# Patient Record
Sex: Male | Born: 1940 | Race: White | Hispanic: No | Marital: Married | Smoking: Never smoker
Health system: Southern US, Community
[De-identification: ages and names within clinical notes are randomized; demographics above are authoritative.]

---

## 2007-07-16 ENCOUNTER — Ambulatory Visit: Payer: Self-pay | Admitting: Family Medicine

## 2008-12-19 ENCOUNTER — Emergency Department: Payer: Self-pay | Admitting: Emergency Medicine

## 2012-07-09 ENCOUNTER — Emergency Department: Payer: Self-pay | Admitting: Emergency Medicine

## 2012-07-10 LAB — URINALYSIS, COMPLETE
Bacteria: NONE SEEN
Bilirubin,UR: NEGATIVE
Glucose,UR: NEGATIVE mg/dL (ref 0–75)
Hyaline Cast: 3
Ketone: NEGATIVE
Ph: 6 (ref 4.5–8.0)
RBC,UR: 41 /HPF (ref 0–5)
Squamous Epithelial: NONE SEEN

## 2013-07-11 ENCOUNTER — Emergency Department: Payer: Self-pay | Admitting: Emergency Medicine

## 2013-07-11 LAB — COMPREHENSIVE METABOLIC PANEL
ALT: 15 U/L (ref 12–78)
ANION GAP: 2 — AB (ref 7–16)
Albumin: 3.4 g/dL (ref 3.4–5.0)
Alkaline Phosphatase: 51 U/L
BILIRUBIN TOTAL: 0.6 mg/dL (ref 0.2–1.0)
BUN: 14 mg/dL (ref 7–18)
CO2: 31 mmol/L (ref 21–32)
Calcium, Total: 8.8 mg/dL (ref 8.5–10.1)
Chloride: 104 mmol/L (ref 98–107)
Creatinine: 0.84 mg/dL (ref 0.60–1.30)
EGFR (African American): >60
GLUCOSE: 116 mg/dL — AB (ref 65–99)
OSMOLALITY: 275 (ref 275–301)
POTASSIUM: 4.4 mmol/L (ref 3.5–5.1)
SGOT(AST): 31 U/L (ref 15–37)
SODIUM: 137 mmol/L (ref 136–145)
Total Protein: 6.6 g/dL (ref 6.4–8.2)

## 2013-07-11 LAB — TROPONIN I: Troponin-I: <0.02 ng/mL

## 2013-07-11 LAB — CBC
HCT: 44.7 % (ref 40.0–52.0)
HGB: 14.9 g/dL (ref 13.0–18.0)
MCH: 30.4 pg (ref 26.0–34.0)
MCHC: 33.3 g/dL (ref 32.0–36.0)
MCV: 92 fL (ref 80–100)
PLATELETS: 151 10*3/uL (ref 150–440)
RBC: 4.89 10*6/uL (ref 4.40–5.90)
RDW: 14 % (ref 11.5–14.5)
WBC: 8.5 10*3/uL (ref 3.8–10.6)

## 2013-07-11 LAB — PRO B NATRIURETIC PEPTIDE: B-Type Natriuretic Peptide: 648 pg/mL — ABNORMAL HIGH (ref 0–125)

## 2014-05-27 IMAGING — CT CT ANGIO CHEST
3 of 8 series · 16 of 36 positions shown · IV contrast (APPLIED)
Comparison: Chest radiograph performed earlier today at [DATE] p.m.

CLINICAL DATA: Shortness of breath for 2 days.  Tachypnea.

EXAM:
CT ANGIOGRAPHY CHEST WITH CONTRAST
TECHNIQUE: Multidetector CT imaging of the chest was performed using the
standard protocol during bolus administration of intravenous
contrast. Multiplanar CT image reconstructions including MIPs were
obtained to evaluate the vascular anatomy.
CONTRAST:  80 mL of Isovue 370 IV contrast

[Series 5: pe 1.0 thins · axial · 0.79mm/px · z∈[-81,+90]mm · 10 of 211 slices shown (1 of 2)]
[im 20/211  lung]
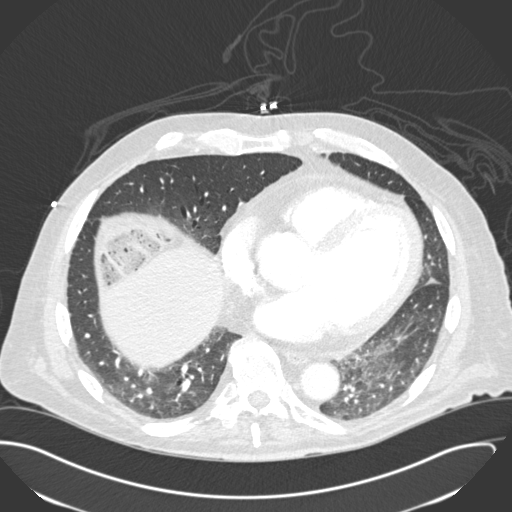
[im 39/211  mediastinal]
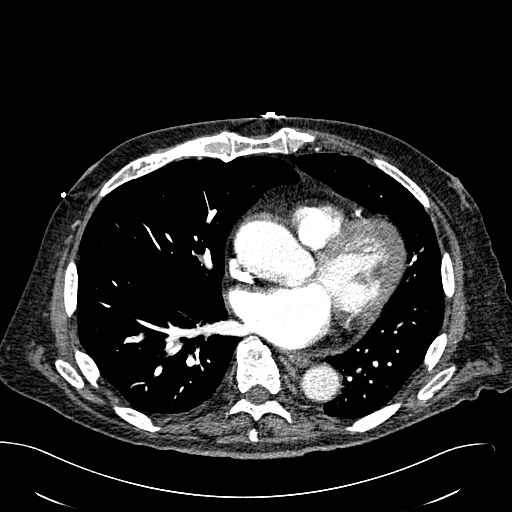
[im 58/211  lung]
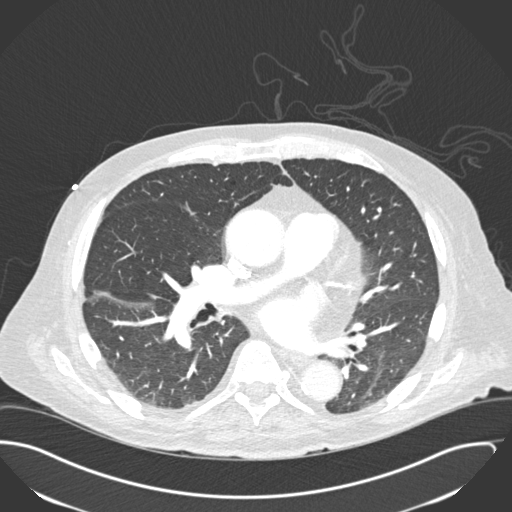
[im 77/211  mediastinal]
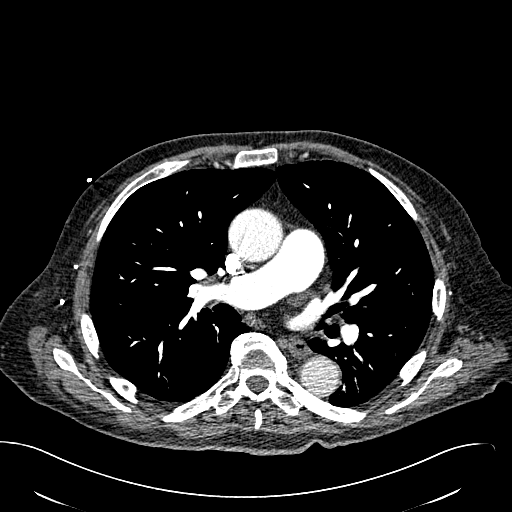
[im 96/211  lung]
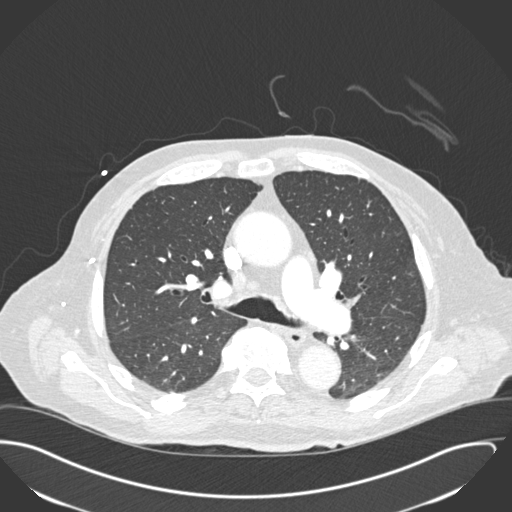
[im 115/211  mediastinal]
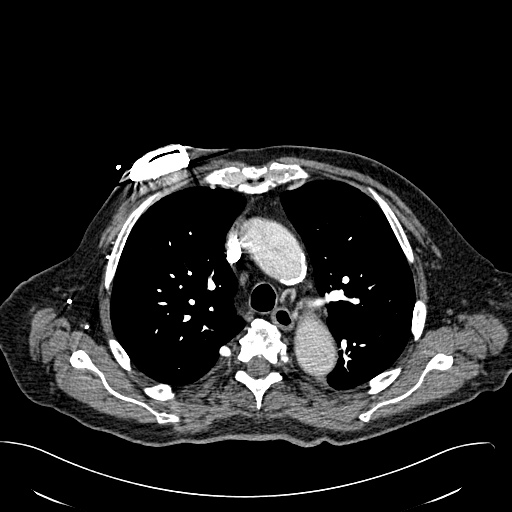
[im 134/211  lung]
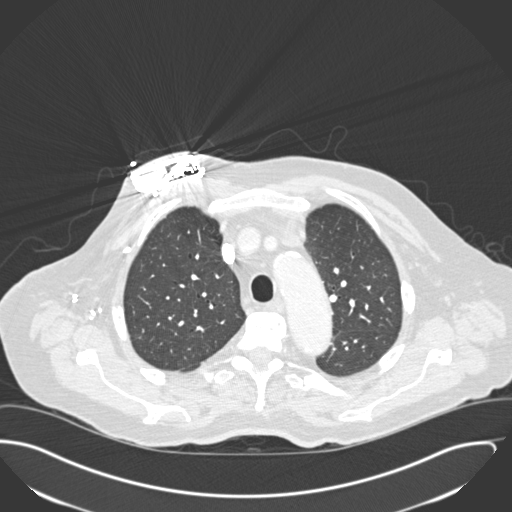
[im 153/211  mediastinal]
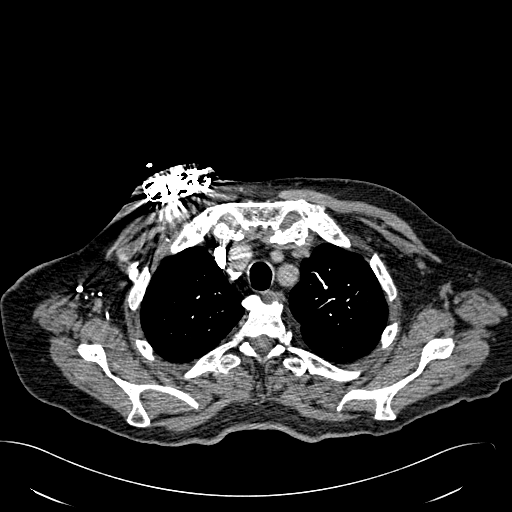
[im 172/211  lung]
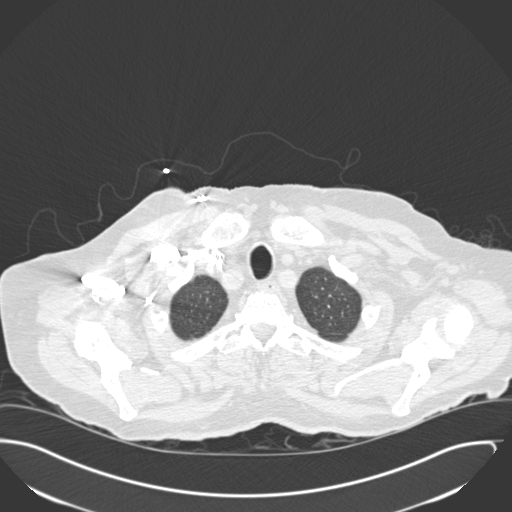
[im 191/211  mediastinal]
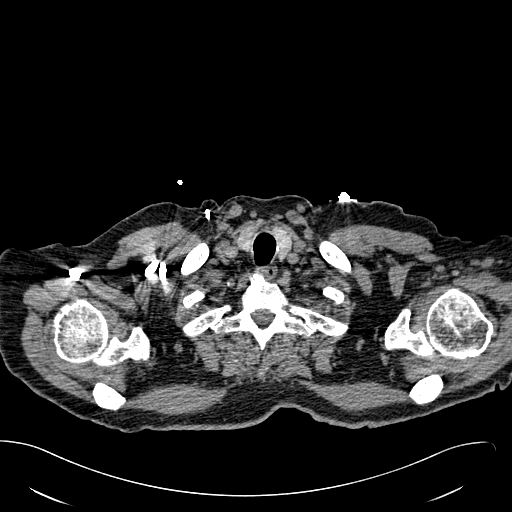

[Series 8: pe 1.0 thins · axial · 0.79mm/px · z∈[-127,-51]mm · 5 of 114 slices shown (2 of 2)]
[im 19/114  lung]
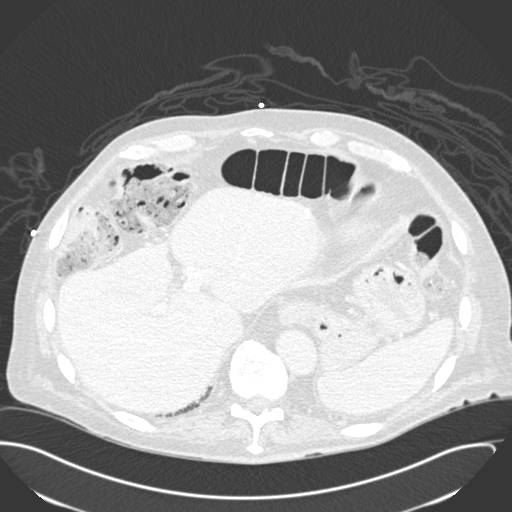
[im 38/114  lung]
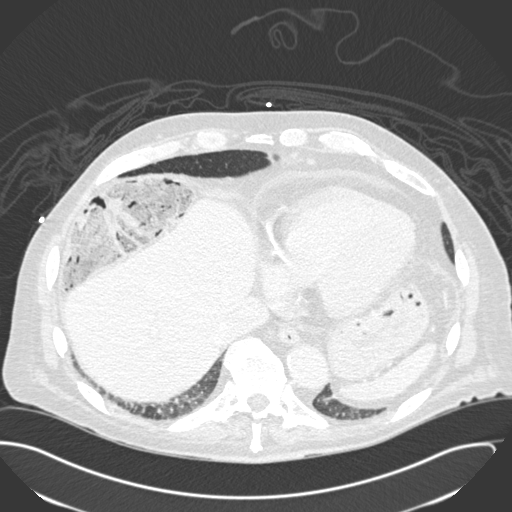
[im 57/114  lung]
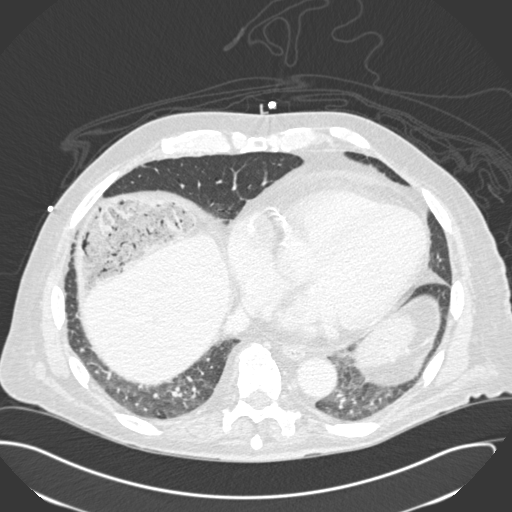
[im 76/114  lung]
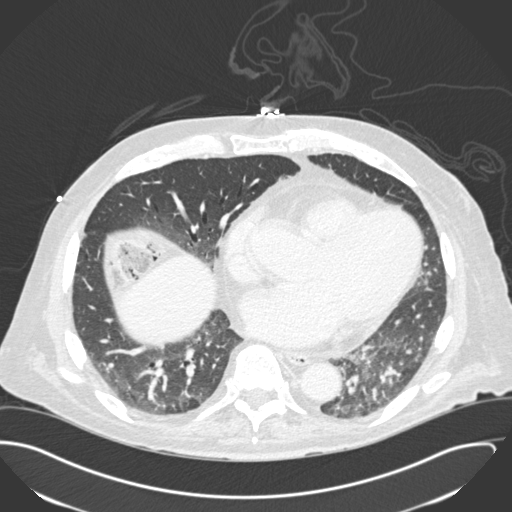
[im 95/114  lung]
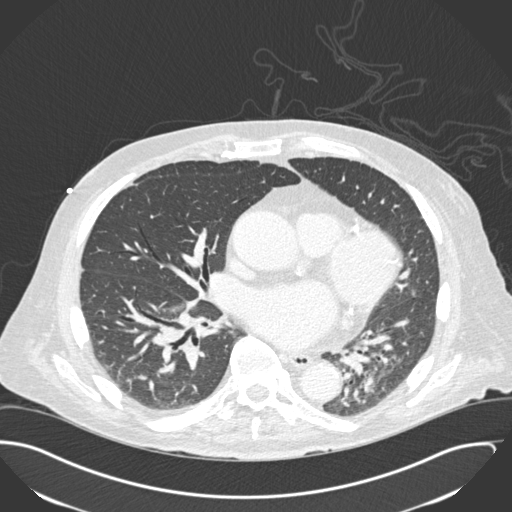

[Series 10: cor pe 2.0 mpr · coronal · 0.42mm/px · 1 of 132 slices shown]
[im 66/132  mediastinal]
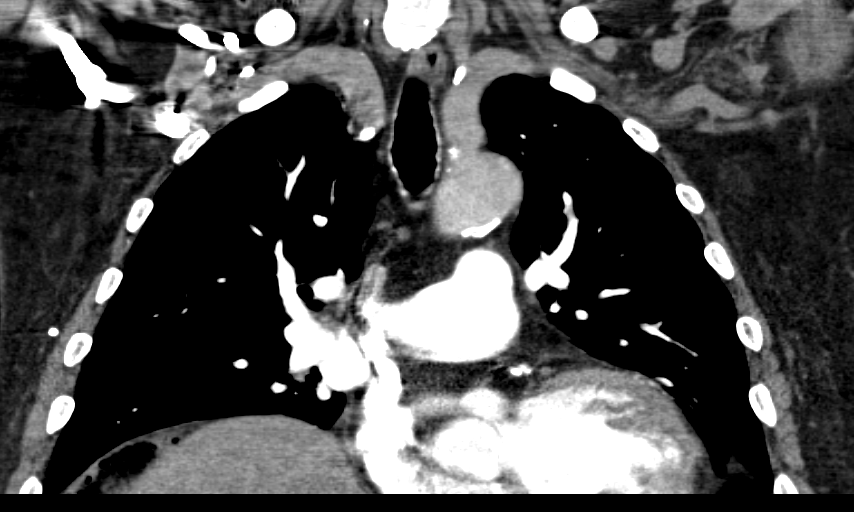

[16 of 36 positions shown; findings below may reference images not displayed]

FINDINGS: There is no evidence of pulmonary embolus.

Minimal bibasilar opacities likely reflect atelectasis. There is no
evidence of pleural effusion or pneumothorax. No masses are
identified; no abnormal focal contrast enhancement is seen.

There is aneurysmal dilatation of the ascending thoracic aorta to
4.6 cm in maximal AP dimension. Aneurysmal dilatation resolves at
the level of the aortic arch. Diffuse coronary artery calcifications
are seen. Trace pericardial fluid remains within normal limits. No
mediastinal lymphadenopathy is seen. The great vessels are grossly
unremarkable in appearance. Scattered calcific atherosclerotic
disease is seen along the aortic arch. No axillary lymphadenopathy
is seen. The visualized portions of the thyroid gland are
unremarkable in appearance.

A metallic device is seen along the right chest wall. The visualized
portions of the liver and spleen are unremarkable. There is
interposition of the hepatic flexure of the colon anterior to the
liver.

No acute osseous abnormalities are seen. Anterior bridging
osteophytes are seen along the lower thoracic spine, compatible with
DISH.

Review of the MIP images confirms the above findings.
IMPRESSION: 1. No evidence of pulmonary embolus.
2. Minimal bibasilar airspace opacities likely reflect atelectasis;
lungs otherwise clear.
3. Aneurysmal dilatation of the ascending thoracic aorta to 4.6 cm
in maximal AP dimension; this resolves at the level of the aortic
arch.
4. Diffuse coronary artery calcifications seen.

## 2015-01-29 ENCOUNTER — Emergency Department: Payer: Medicare Other

## 2015-01-29 ENCOUNTER — Emergency Department
Admission: EM | Admit: 2015-01-29 | Discharge: 2015-01-29 | Disposition: A | Discharge status: Other Acute Inpatient Hospital | Payer: Medicare Other | Attending: Emergency Medicine | Admitting: Emergency Medicine

## 2015-01-29 DIAGNOSIS — M6281 Muscle weakness (generalized): Secondary | ICD-10-CM | POA: Diagnosis present

## 2015-01-29 DIAGNOSIS — I639 Cerebral infarction, unspecified: Secondary | ICD-10-CM | POA: Insufficient documentation

## 2015-01-29 LAB — CBC WITH DIFFERENTIAL/PLATELET
BASOS ABS: 0 10*3/uL (ref 0–0.1)
Basophils Relative: 1 %
Eosinophils Absolute: 0.3 10*3/uL (ref 0–0.7)
Eosinophils Relative: 4 %
HCT: 40.4 % (ref 40.0–52.0)
HEMOGLOBIN: 13.9 g/dL (ref 13.0–18.0)
LYMPHS PCT: 27 %
Lymphs Abs: 1.9 10*3/uL (ref 1.0–3.6)
MCH: 31.4 pg (ref 26.0–34.0)
MCHC: 34.6 g/dL (ref 32.0–36.0)
MCV: 90.9 fL (ref 80.0–100.0)
Monocytes Absolute: 0.5 10*3/uL (ref 0.2–1.0)
Monocytes Relative: 7 %
NEUTROS ABS: 4.4 10*3/uL (ref 1.4–6.5)
Neutrophils Relative %: 61 %
Platelets: 128 10*3/uL — ABNORMAL LOW (ref 150–440)
RBC: 4.44 MIL/uL (ref 4.40–5.90)
RDW: 14 % (ref 11.5–14.5)
WBC: 7.1 10*3/uL (ref 3.8–10.6)

## 2015-01-29 LAB — COMPREHENSIVE METABOLIC PANEL
ALBUMIN: 3.6 g/dL (ref 3.5–5.0)
ALK PHOS: 50 U/L (ref 38–126)
ALT: 20 U/L (ref 17–63)
ANION GAP: 7 (ref 5–15)
AST: 24 U/L (ref 15–41)
BUN: 17 mg/dL (ref 6–20)
CO2: 29 mmol/L (ref 22–32)
CREATININE: 0.79 mg/dL (ref 0.61–1.24)
Calcium: 8.9 mg/dL (ref 8.9–10.3)
Chloride: 101 mmol/L (ref 101–111)
GFR calc Af Amer: >60 mL/min (ref >60)
GFR calc non Af Amer: >60 mL/min (ref >60)
GLUCOSE: 110 mg/dL — AB (ref 65–99)
POTASSIUM: 4.3 mmol/L (ref 3.5–5.1)
SODIUM: 137 mmol/L (ref 135–145)
Total Bilirubin: 0.6 mg/dL (ref 0.3–1.2)
Total Protein: 6.3 g/dL — ABNORMAL LOW (ref 6.5–8.1)

## 2015-01-29 LAB — URINALYSIS COMPLETE WITH MICROSCOPIC (ARMC ONLY)
BILIRUBIN URINE: NEGATIVE
Bacteria, UA: NONE SEEN
Glucose, UA: NEGATIVE mg/dL
HGB URINE DIPSTICK: NEGATIVE
KETONES UR: NEGATIVE mg/dL
Nitrite: NEGATIVE
PROTEIN: NEGATIVE mg/dL
Specific Gravity, Urine: 1.009 (ref 1.005–1.030)
pH: 7 (ref 5.0–8.0)

## 2015-01-29 LAB — TROPONIN I

## 2015-01-29 MED ORDER — PROPRANOLOL HCL 1 MG/ML IV SOLN
1.0000 mg | Freq: Once | INTRAVENOUS | Status: AC
  Administered 2015-01-29: 1 mg via INTRAVENOUS
  Filled 2015-01-29: qty 1

## 2015-01-29 MED ORDER — ASPIRIN 81 MG PO CHEW
324.0000 mg | CHEWABLE_TABLET | Freq: Once | ORAL | Status: AC
  Administered 2015-01-29: 324 mg via ORAL
  Filled 2015-01-29: qty 4

## 2015-01-29 NOTE — ED Notes (Signed)
[facility] to call [facility] after they verify they are accepting him

## 2015-01-29 NOTE — Care Management Note (Signed)
Case Management Note  Patient Details  Name: NUNO BRUBACHER MRN: [id number] Date of Birth: [birth year]  Subjective/Objective:    Pt [facility]                Action/Plan:   Expected Discharge Date:                  Expected Discharge Plan:     In-House Referral:     Discharge planning Services     Post Acute Care Choice:    Choice offered to:     DME Arranged:    DME Agency:     HH Arranged:    HH Agency:     Status of Service:     Medicare Important Message Given:    Date Medicare IM Given:    Medicare IM give by:    Date Additional Medicare IM Given:    Additional Medicare Important Message give by:     If discussed at Long Length of Stay Meetings, dates discussed:    Additional Comments:  Berna Bue, RN 01/29/2015, 2:57 PM

## 2015-01-29 NOTE — ED Notes (Signed)
Pt BIB EMS from home, reports left leg dragging and poss def in left arm.  Left arm resolved.  Some remaining problems with left leg, pt alert and oriented, did not take morning BP meds

## 2015-01-29 NOTE — Consult Note (Signed)
[facility] [place] at [facility]   PATIENT NAME: Tyler Beck    MR#:  [id number]  DATE OF BIRTH:  Aug 31, 1940  DATE OF ADMISSION:  01/29/2015  [facility]   REQUESTING/REFERRING PHYSICIAN: Dr. Lenard Lance   CHIEF COMPLAINT:  Stroke left sided weakness  HISTORY OF PRESENT ILLNESS:  Tyler Beck  is a 74 y.o. male with a known history of hypertension, diabetes, essential tremor with vagal nerve stimulator who presents with above complaint. Patient will up this morning at approximately 8:00 and at approximate 8:30 he noted his left side to be very weak and was unable to move it. Family also noted slurred speech. They called EMS and he was brought here via EMS. As far as his left side weakness it is slowly improving. He continues have a very mild slurred speech as well as a slight left facial droop. Patient also reports a headache this morning which is very unusual for him. Patient went to sleep yesterday in his usual state of health without any issues overnight.  PAST MEDICAL HISTORY:  Diabetes Essential hypertension  PAST SURGICAL HISTORY:  Patient has a vagal nerve stimulator Productively  SOCIAL HISTORY:  No tobacco, alcohol or IV drug use  FAMILY HISTORY:  Positive history of lung cancer  DRUG ALLERGIES:  No known drug allergies  REVIEW OF SYSTEMS:  CONSTITUTIONAL: No fever, fatigue or weakness. Positive headache EYES: No blurred or double vision.  EARS, NOSE, AND THROAT: No tinnitus or ear pain.  RESPIRATORY: No cough, shortness of breath, wheezing or hemoptysis.  CARDIOVASCULAR: No chest pain, orthopnea, edema.  GASTROINTESTINAL: No nausea, vomiting, diarrhea or abdominal pain.  GENITOURINARY: No dysuria, hematuria.  ENDOCRINE: No polyuria, nocturia,  HEMATOLOGY: No anemia, easy bruising or bleeding SKIN: No rash or lesion. MUSCULOSKELETAL: No joint pain or arthritis.   NEUROLOGIC: Positive left-sided weakness, left facial  droop and slurred speech PSYCHIATRY: No anxiety or depression.   MEDICATIONS AT HOME:   Pharmacy is updating patient's medications VITAL SIGNS:  Blood pressure 202/80, pulse 58, temperature 98.2 F (36.8 C), temperature source Oral, resp. rate 14, SpO2 96 %.  PHYSICAL EXAMINATION:  GENERAL:  74 y.o.-year-old patient lying in the bed with no acute distress.  EYES: Pupils equal, round, reactive to light and accommodation. No scleral icterus. Extraocular muscles intact.  HEENT: Head atraumatic, normocephalic. Oropharynx and nasopharynx clear.  NECK:  Supple, no jugular venous distention. No thyroid enlargement, no tenderness.  LUNGS: Normal breath sounds bilaterally, no wheezing, rales,rhonchi or crepitation. No use of accessory muscles of respiration.  CARDIOVASCULAR: S1, S2 normal. No murmurs, rubs, or gallops.  ABDOMEN: Soft, nontender, nondistended. Bowel sounds present. No organomegaly or mass.  EXTREMITIES: No pedal edema, cyanosis, or clubbing.  NEUROLOGIC: Patient has mild slurred speech with mild left facial droop all other cranial nerves are intact. Strength is 5 out of 5 bilaterally and symmetrically. Cerebellar exam is normal. PSYCHIATRIC: The patient is alert and oriented x 3.  SKIN: No obvious rash, lesion, or ulcer.   LABORATORY PANEL:   CBC  Recent Labs Lab 01/29/15 0929  WBC 7.1  HGB 13.9  HCT 40.4  PLT 128*   ------------------------------------------------------------------------------------------------------------------  Chemistries   Recent Labs Lab 01/29/15 0929  NA 137  K 4.3  CL 101  CO2 29  GLUCOSE 110*  BUN 17  CREATININE 0.79  CALCIUM 8.9  AST 24  ALT 20  ALKPHOS 50  BILITOT 0.6   ------------------------------------------------------------------------------------------------------------------  Cardiac Enzymes  Recent Labs Lab  01/29/15 0929  TROPONINI <0.03    ------------------------------------------------------------------------------------------------------------------  RADIOLOGY:  Ct Head Wo Contrast  01/29/2015   CLINICAL DATA:  74 year old male with new onset of left arm and leg weakness. Stimulating device in place for tremor. Initial encounter.  EXAM: CT HEAD WITHOUT CONTRAST  TECHNIQUE: Contiguous axial images were obtained from the base of the skull through the vertex without intravenous contrast.  COMPARISON:  None.  FINDINGS: Stimulating wires enter from the right frontal region, coarse immediately adjacent to the lateral ventricles with the tip at the junction of the lower thalamus/ upper aspect of the cerebral peduncles.  Streak artifacts somewhat limits evaluation. No intracranial hemorrhage identified.  Hypodensity right frontal lobe consistent with prior insult which may represent remote infarct. Taking into account limitation by streak artifact and lack of prior exams, no CT evidence of large acute infarct separate from this region.  Mild global atrophy without hydrocephalus.  Vascular calcifications.  IMPRESSION: Stimulating wires enter from the right frontal region, coarse immediately adjacent to the lateral ventricles with the tip at the junction of the lower thalamus/ upper aspect of the cerebral peduncles.  Streak artifacts somewhat limits evaluation. No intracranial hemorrhage identified.  Hypodensity right frontal lobe consistent with prior insult which may represent remote infarct. Taking into account limitation by streak artifact and lack of prior exams, no CT evidence of large acute infarct separate from this region.  Mild global atrophy without hydrocephalus.   Electronically Signed   By: Lacy Duverney M.D.   On: 01/29/2015 10:20    EKG:  NO ST elevation or depression  IMPRESSION AND PLAN:  74 year old male with a history of hypertension and diabetes who presents with left-sided weakness, slurred speech and facial droop.  1.  Acute CVA: Patient symptoms are consistent with a stroke. Patient is unable to obtain an MRI due to the fact the patient has a vagal nerve stimulator. I will order a neurology consultation. Neuro checks every 4 hours. He may need a repeat CT scan in 36 hours.He will need ultrasound of the carotids as well as echocardiogram. He is on aspirin daily and will need to change to Plavix. He will require physical therapy, occupational therapy and speech consultation.   2. Malignant hypertension: Patient's blood pressure systolic is over 063. Allow some permissive hypertension. Hydralazine can be ordered when necessary for systolic blood pressure greater than 180 or diastolic blood pressure greater than 100.  3. Diabetes: Patient will resume his outpatient medications. ADA diet and sliding scale insulin and have also been ordered.  4. Essential tremor: Patient has a vagal nerve stimulator.  Patient is being transferred to [place]. He [place] a [place] patient.     All the records are reviewed and case discussed with ED provider. Management plans discussed with the patient and family and they are in agreement.  CODE STATUS: DNR  TOTAL TIME TAKING CARE OF THIS PATIENT: 50 minutes.    Manju Kulkarni M.D on 01/29/2015 at 2:45 PM  Between 7am to 6pm - Pager - [phone] After 6pm go to [email or web address] - password [email or web address] [facility]  [facility]  Office  [phone]  CC: Primary care physician; No primary care provider on file.

## 2015-01-29 NOTE — ED Notes (Signed)
Resumed care from [facility]; Pt alert & oriented. Denies pain, although he states he has some pressure in his stomach. NAD NAD noted.

## 2015-01-29 NOTE — ED Provider Notes (Signed)
[facility] Emergency Department Provider Note  Time seen: 9:23 AM  I have reviewed the triage vital signs and the nursing notes.   HISTORY  Chief Complaint Cerebrovascular Accident    HPI Tyler Beck is a 74 y.o. male presents the emergency department with left-sided weakness. According to the patient he felt normal going to bed last night. He states he woke up at 8:15 this morning, and attempted to get out of bed but he could not lift his left leg adequately. He states he was dragging his left leg down the hallway, and needed his wife's help to ambulate. He states he has never had weakness in his leg before. Denies any history of stroke. Patient's last known normal was last night before going to bed. Denies any use of blood thinners. Denies any recent falls or trauma.Does state mild current headache.     No past medical history on file.  There are no active problems to display for this patient.   No past surgical history on file.  No current outpatient prescriptions on file.  Allergies Review of patient's allergies indicates not on file.  No family history on file.  Social History History  Substance Use Topics  . Smoking status: Not on file  . Smokeless tobacco: Not on file  . Alcohol Use: Not on file    Review of Systems Constitutional: Negative for fever Cardiovascular: Negative for chest pain. Respiratory: Negative for shortness of breath. Musculoskeletal: Negative for back pain. Neurological: Mild headache. Left leg and arm weakness. Denies any numbness. 10-point ROS otherwise negative.  ____________________________________________   PHYSICAL EXAM:  Constitutional: Alert and oriented. Well appearing and in no distress. Eyes: Normal exam, 2 mm PERRL bilaterally ENT   Head: Normocephalic and atraumatic. Cardiovascular: Normal rate, regular rhythm. No murmur Respiratory: Normal respiratory effort without tachypnea nor  retractions. Breath sounds are clear and equal bilaterally. No wheezes/rales/rhonchi. Gastrointestinal: Soft and nontender. No distention.  Musculoskeletal: Nontender, normal range of motion in all extremities. No lower extremity tenderness or swelling. Neurologic:  Normal speech and language. Patient with 4+/5 motor in left lower extremity, minimal drift after 5 seconds. All other extremities are 5/5 without drift. Cranial nerves intact. Skin:  Skin is warm, dry and intact.  Psychiatric: Mood and affect are normal. Speech and behavior are normal.   ____________________________________________    EKG  EKG reviewed and interpreted by myself shows sinus bradycardia 56 bpm, narrow QRS, normal axis, prolonged PR interval consistent with a first-degree AV block, nonspecific ST changes are present.  ____________________________________________    RADIOLOGY  CT is most consistent with a remote right frontal infarct. Patient denies any history of stroke. Patient does have a deep brain stimulator for his tremor which makes interpretation more difficult.  ____________________________________________    INITIAL IMPRESSION / ASSESSMENT AND PLAN / ED COURSE  Pertinent labs & imaging results that were available during my care of the patient were reviewed by me and considered in my medical decision making (see chart for details).  Patient with new onset left lower extremity weakness. He also says his left arm feels heavy, and he has a mild headache. Last known normal was last night before going to bed. Patient is not a TPA candidate given unknown onset of symptoms. We'll proceed with stroke workup including labs, CT head. We'll closely monitor in the emergency department while awaiting results.  CT shows a possible remote infarct, but difficult to interpret given deep brain stimulator. Given new onset of  symptoms, we will admit to the hospital for a stroke workup. Patient's labs are largely within  normal limits. Currently NIH stroke scale of 3, given the left lower extremity weakness, and limb ataxia which is likely due to underlying tremor.   NIH Stroke Scale    Time: 10:58 AM Person Administering Scale: Dennison Mcdaid  Administer stroke scale items in the order listed. Record performance in each category after each subscale exam. Do not go back and change scores. Follow directions provided for each exam technique. Scores should reflect what the patient does, not what the clinician thinks the patient can do. The clinician should record answers while administering the exam and work quickly. Except where indicated, the patient should not be coached (i.e., repeated requests to patient to make a special effort).   1a  Level of consciousness: 0=alert; keenly responsive  1b. LOC questions:  0=Performs both tasks correctly  1c. LOC commands: 0=Performs both tasks correctly  2.  Best Gaze: 0=normal  3.  Visual: 0=No visual loss  4. Facial Palsy: 0=Normal symmetric movement  5a.  Motor left arm: 0=No drift, limb holds 90 (or 45) degrees for full 10 seconds  5b.  Motor right arm: 0=No drift, limb holds 90 (or 45) degrees for full 10 seconds  6a. motor left leg: 1=Drift, limb holds 90 (or 45) degrees but drifts down before full 10 seconds: does not hit bed  6b  Motor right leg:  0=No drift, limb holds 90 (or 45) degrees for full 10 seconds  7. Limb Ataxia: 2=Present in two limbs  8.  Sensory: 0=Normal; no sensory loss  9. Best Language:  0=No aphasia, normal  10. Dysarthria: 0=Normal  11. Extinction and Inattention: 0=No abnormality  12. Distal motor function: 0=Normal   Total:   3     ____________________________________________   FINAL CLINICAL IMPRESSION(S) / ED DIAGNOSES  Left leg weakness Cerebrovascular accident  Minna Antis, MD 01/29/15 1059

## 2015-01-29 NOTE — ED Notes (Signed)
[facility]; gave report to rosanna; we are to call her when ems leaves.

## 2015-01-29 NOTE — Care Management Note (Signed)
Case Management Note  Patient Details  Name: DAEMYN GARIEPY MRN: [id number] Date of Birth: [birth year]  Subjective/Objective:            The pt. Has VA benefits and both he and family at bedside understand same. Paperwork to be filled out by [employer], unit sec, and Dr Laurell Josephs to go to [place], Pt family and pt. Agree to transfer pt. If bed available.  Dr Juliene Pina also made aware.        Action/Plan:   Expected Discharge Date:                  Expected Discharge Plan:     In-House Referral:     Discharge planning Services     Post Acute Care Choice:    Choice offered to:     DME Arranged:    DME Agency:     HH Arranged:    HH Agency:     Status of Service:     Medicare Important Message Given:    Date Medicare IM Given:    Medicare IM give by:    Date Additional Medicare IM Given:    Additional Medicare Important Message give by:     If discussed at Long Length of Stay Meetings, dates discussed:    Additional Comments:  Berna Bue, RN 01/29/2015, 11:14 AM

## 2015-01-29 NOTE — ED Notes (Signed)
rcvd call from [facility] center; gave me number to call report [phone]; we are to arrange transport

## 2015-12-15 IMAGING — CT CT HEAD W/O CM
1 series · 15 of 28 positions shown, 19 images · non-contrast
Comparison: None.

CLINICAL DATA: 74-year-old male with new onset of left arm and leg
weakness. Stimulating device in place for tremor. Initial encounter.

EXAM:
CT HEAD WITHOUT CONTRAST
TECHNIQUE: Contiguous axial images were obtained from the base of the skull
through the vertex without intravenous contrast.

[Series 2: soft tissue · axial · 0.42mm/px · z∈[+586,+711]mm · 15 of 28 slices shown, 19 images]
[im 2/28  brain]
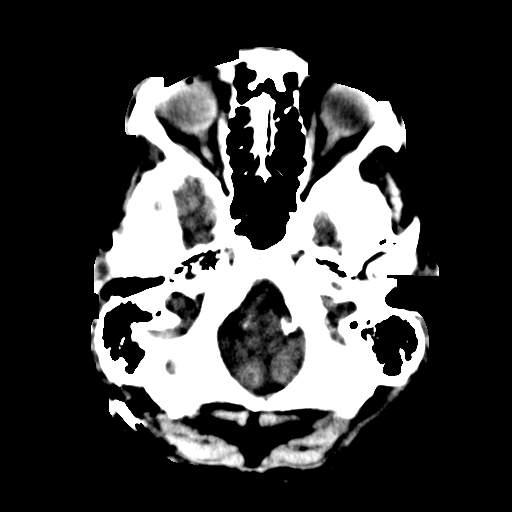
[im 2/28  bone]
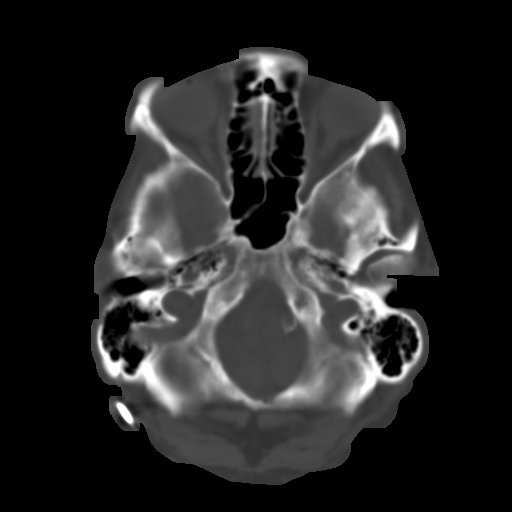
[im 4/28  brain]
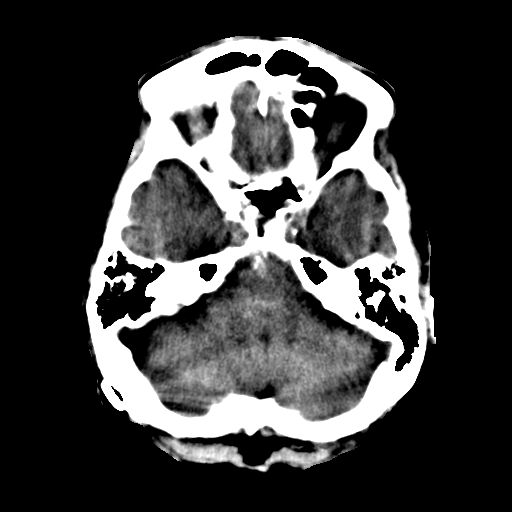
[im 6/28  brain]
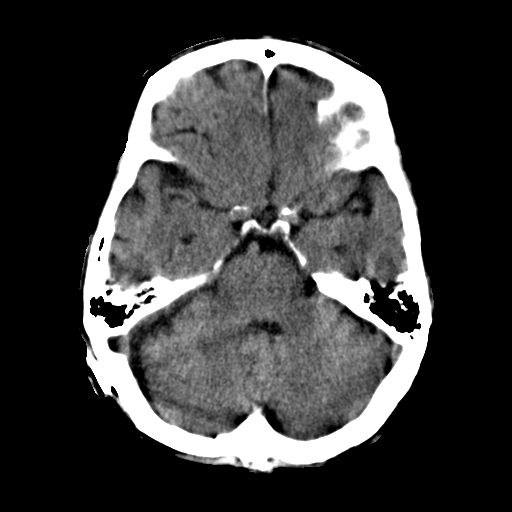
[im 8/28  brain]
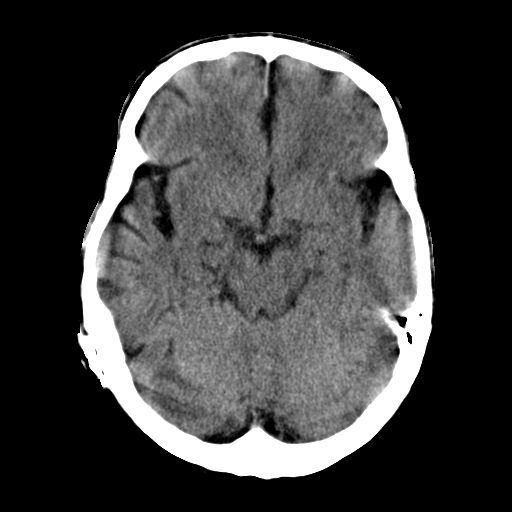
[im 9/28  brain]
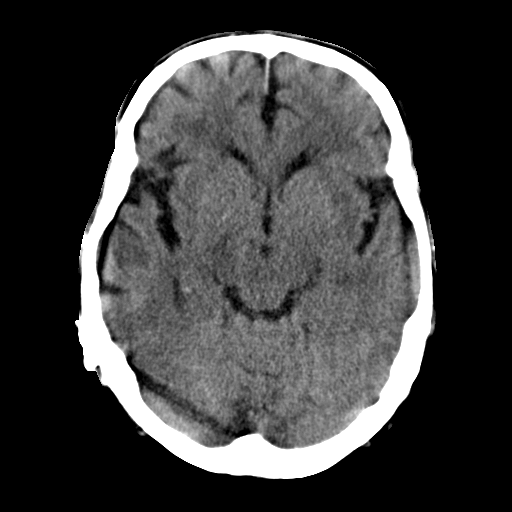
[im 9/28  bone]
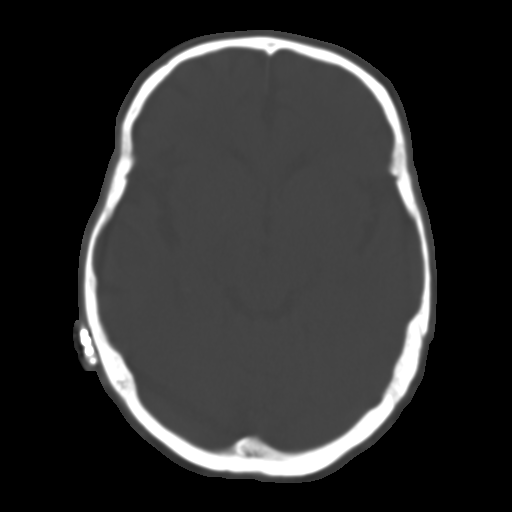
[im 11/28  brain]
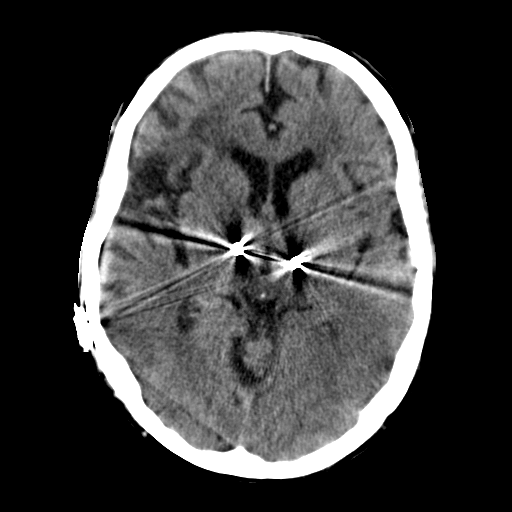
[im 13/28  brain]
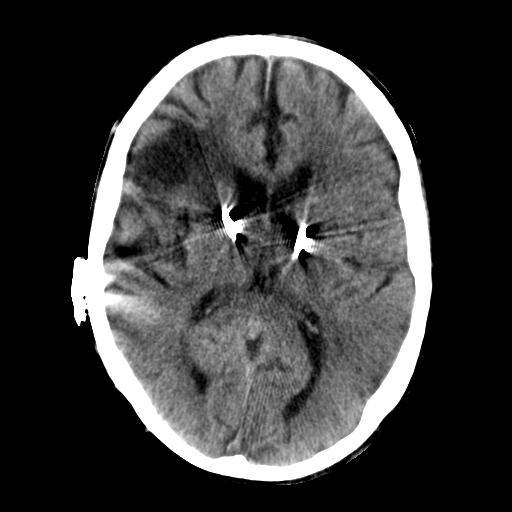
[im 15/28  brain]
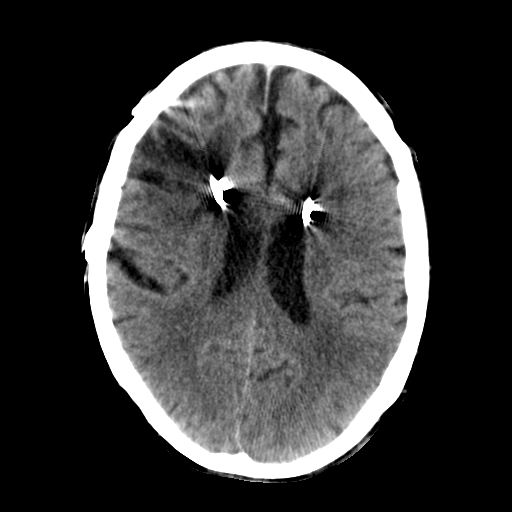
[im 16/28  brain]
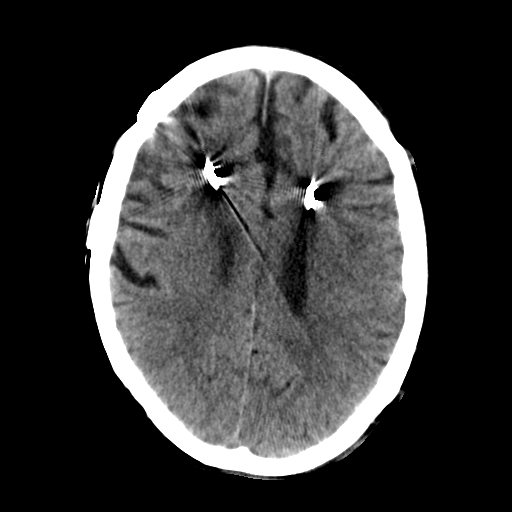
[im 16/28  bone]
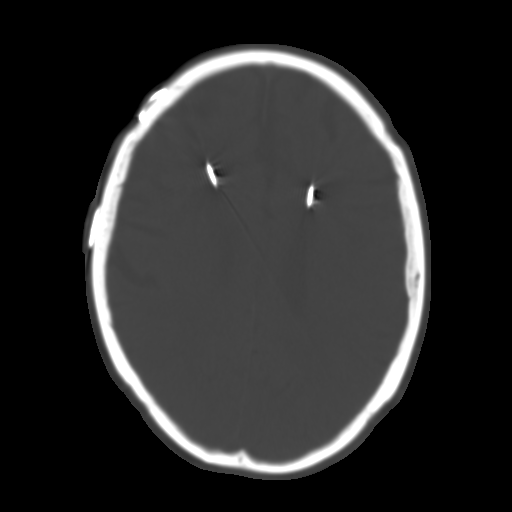
[im 18/28  brain]
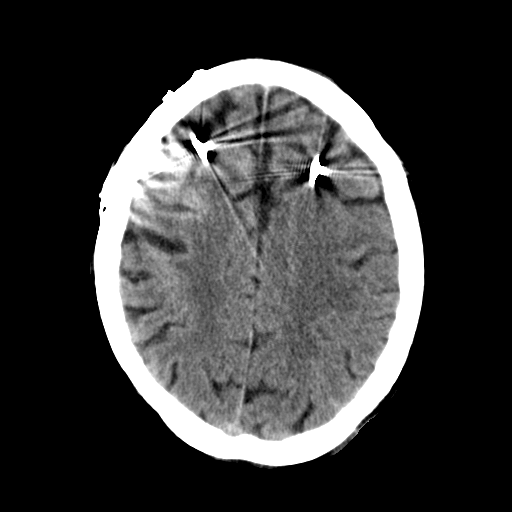
[im 20/28  brain]
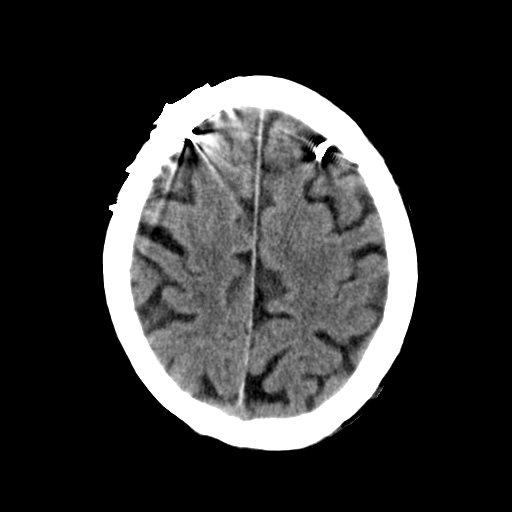
[im 21/28  brain]
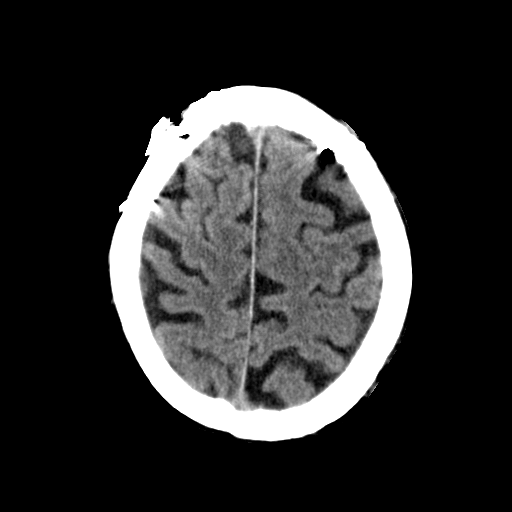
[im 23/28  brain]
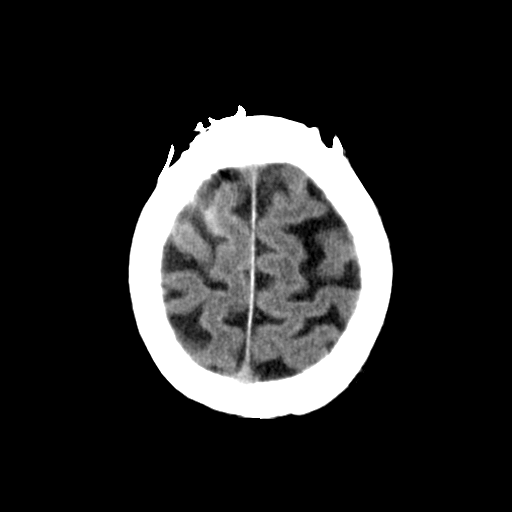
[im 23/28  bone]
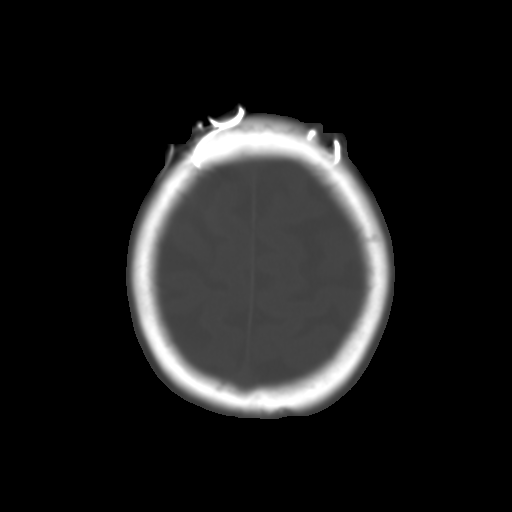
[im 25/28  brain]
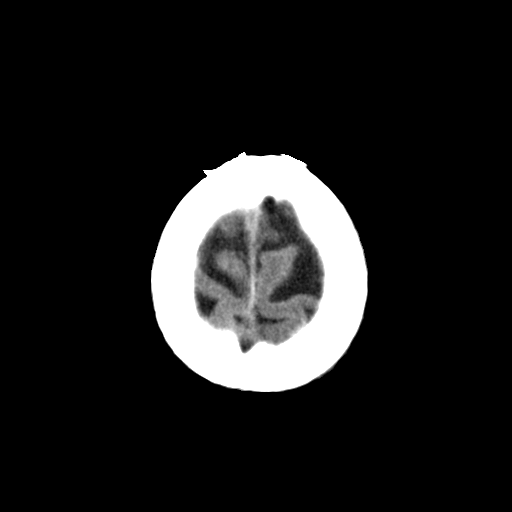
[im 27/28  brain]
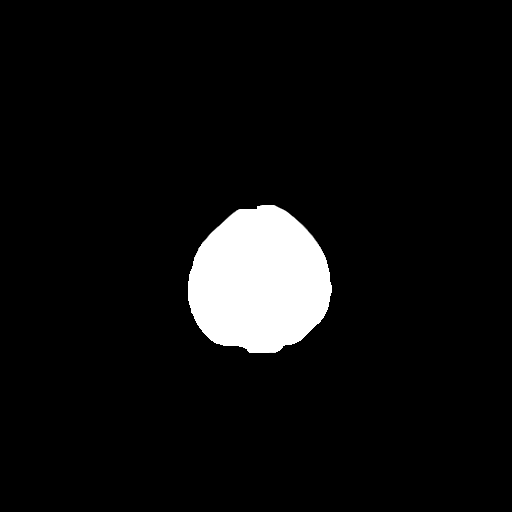

[15 of 28 positions shown; findings below may reference images not displayed]

FINDINGS: Stimulating wires enter from the right frontal region, coarse
immediately adjacent to the lateral ventricles with the tip at the
junction of the lower thalamus/ upper aspect of the cerebral
peduncles.

Streak artifacts somewhat limits evaluation. No intracranial
hemorrhage identified.

Hypodensity right frontal lobe consistent with prior insult which
may represent remote infarct. Taking into account limitation by
streak artifact and lack of prior exams, no CT evidence of large
acute infarct separate from this region.

Mild global atrophy without hydrocephalus.

Vascular calcifications.
IMPRESSION: Stimulating wires enter from the right frontal region, coarse
immediately adjacent to the lateral ventricles with the tip at the
junction of the lower thalamus/ upper aspect of the cerebral
peduncles.

Streak artifacts somewhat limits evaluation. No intracranial
hemorrhage identified.

Hypodensity right frontal lobe consistent with prior insult which
may represent remote infarct. Taking into account limitation by
streak artifact and lack of prior exams, no CT evidence of large
acute infarct separate from this region.

Mild global atrophy without hydrocephalus.

## 2017-04-30 ENCOUNTER — Encounter: Payer: Self-pay | Admitting: Podiatry

## 2017-04-30 ENCOUNTER — Ambulatory Visit (INDEPENDENT_AMBULATORY_CARE_PROVIDER_SITE_OTHER): Payer: No Typology Code available for payment source | Admitting: Podiatry

## 2017-04-30 DIAGNOSIS — B351 Tinea unguium: Secondary | ICD-10-CM | POA: Diagnosis not present

## 2017-04-30 DIAGNOSIS — M79675 Pain in left toe(s): Secondary | ICD-10-CM | POA: Diagnosis not present

## 2017-04-30 DIAGNOSIS — M79674 Pain in right toe(s): Secondary | ICD-10-CM

## 2017-04-30 NOTE — Progress Notes (Signed)
   Subjective:    Patient ID: Tyler Beck, male    DOB: 01-Nov-1940, 76 y.o.   MRN: [id number]  HPIthis patient presents to the office with long thick painful nails.  He says that his nails are painful walking and wearing his shoes.  Patient states he is not diabetic despite taking metformin and [place]  Marland Kitchen. 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  He presents the office today for an evaluation and treatment of his painful nails.    Review of Systems  All other systems reviewed and are negative.      Objective:   Physical Exam General Appearance  Alert, conversant and in no acute stress.  Vascular  Dorsalis pedis and posterior pulses are palpable  bilaterally.  Capillary return is within normal limits  Bilaterally. Temperature is within normal limits  Bilaterally  Neurologic  Senn-Weinstein monofilament wire test within normal limits  bilaterally. Muscle power  Within normal limits bilaterally.  Nails Thick disfigured discolored nails with subungual debride bilaterally from hallux to fifth toes bilaterally. No evidence of bacterial infection or drainage bilaterally.  Orthopedic  No limitations of motion of motion feet bilaterally.  No crepitus or effusions noted.  No bony pathology or digital deformities noted.  Skin  normotropic skin with no porokeratosis noted bilaterally.  No signs of infections or ulcers noted.          Assessment & Plan:  Onychomycosis  B/l.     IE  Debride nails  X 10.  RTC 3 months.   Helane GuntherGregory Lowell Makara DPM

## 2017-05-29 ENCOUNTER — Telehealth: Payer: Self-pay | Admitting: Podiatry

## 2017-05-29 NOTE — Telephone Encounter (Signed)
Marylene LandAngela, a phone operator in the call center let me know that [facility] and is requesting pt's notes be faxed to her attention at [phone].

## 2017-08-03 ENCOUNTER — Ambulatory Visit (INDEPENDENT_AMBULATORY_CARE_PROVIDER_SITE_OTHER): Payer: No Typology Code available for payment source | Admitting: Podiatry

## 2017-08-03 ENCOUNTER — Encounter: Payer: Self-pay | Admitting: Podiatry

## 2017-08-03 DIAGNOSIS — M79674 Pain in right toe(s): Secondary | ICD-10-CM | POA: Diagnosis not present

## 2017-08-03 DIAGNOSIS — M79675 Pain in left toe(s): Secondary | ICD-10-CM | POA: Diagnosis not present

## 2017-08-03 DIAGNOSIS — B351 Tinea unguium: Secondary | ICD-10-CM | POA: Diagnosis not present

## 2017-08-03 NOTE — Progress Notes (Signed)
Complaint:  Visit Type: Patient returns to my office for continued preventative foot care services. Complaint: Patient states" my nails have grown long and thick and become painful to walk and wear shoes" . The patient presents for preventative foot care services. No changes to ROS.  Patient is unable to self treat.  Podiatric Exam: Vascular: dorsalis pedis and posterior tibial pulses are palpable bilateral. Capillary return is immediate. Temperature gradient is WNL. Skin turgor WNL  Sensorium: Normal Semmes Weinstein monofilament test. Normal tactile sensation bilaterally. Nail Exam: Pt has thick disfigured discolored nails with subungual debris noted bilateral entire nail hallux through fifth toenails Ulcer Exam: There is no evidence of ulcer or pre-ulcerative changes or infection. Orthopedic Exam: Muscle tone and strength are WNL. No limitations in general ROM. No crepitus or effusions noted. Foot type and digits show no abnormalities. Bony prominences are unremarkable. Skin: No Porokeratosis. No infection or ulcers  Diagnosis:  Onychomycosis, , Pain in right toe, pain in left toes  Treatment & Plan Procedures and Treatment: Consent by patient was obtained for treatment procedures.   Debridement of mycotic and hypertrophic toenails, 1 through 5 bilateral and clearing of subungual debris. No ulceration, no infection noted.  Return Visit-Office Procedure: Patient instructed to return to the office for a follow up visit 3 months for continued evaluation and treatment.    Helane GuntherGregory Wilda Wetherell DPM

## 2017-11-02 ENCOUNTER — Encounter: Payer: Self-pay | Admitting: Podiatry

## 2017-11-02 ENCOUNTER — Ambulatory Visit (INDEPENDENT_AMBULATORY_CARE_PROVIDER_SITE_OTHER): Payer: Non-veteran care | Admitting: Podiatry

## 2017-11-02 DIAGNOSIS — M79675 Pain in left toe(s): Secondary | ICD-10-CM | POA: Diagnosis not present

## 2017-11-02 DIAGNOSIS — M79674 Pain in right toe(s): Secondary | ICD-10-CM

## 2017-11-02 DIAGNOSIS — B351 Tinea unguium: Secondary | ICD-10-CM

## 2017-11-02 NOTE — Progress Notes (Signed)
Complaint:  Visit Type: Patient returns to my office for continued preventative foot care services. Complaint: Patient states" my nails have grown long and thick and become painful to walk and wear shoes" . The patient presents for preventative foot care services. No changes to ROS.  Patient is unable to self treat.  Podiatric Exam: Vascular: dorsalis pedis and posterior tibial pulses are palpable bilateral. Capillary return is immediate. Temperature gradient is WNL. Skin turgor WNL  Sensorium: Normal Semmes Weinstein monofilament test. Normal tactile sensation bilaterally. Nail Exam: Pt has thick disfigured discolored nails with subungual debris noted bilateral entire nail hallux through fifth toenails Ulcer Exam: There is no evidence of ulcer or pre-ulcerative changes or infection. Orthopedic Exam: Muscle tone and strength are WNL. No limitations in general ROM. No crepitus or effusions noted. Foot type and digits show no abnormalities. Bony prominences are unremarkable. Skin: No Porokeratosis. No infection or ulcers  Diagnosis:  Onychomycosis, , Pain in right toe, pain in left toes  Treatment & Plan Procedures and Treatment: Consent by patient was obtained for treatment procedures.   Debridement of mycotic and hypertrophic toenails, 1 through 5 bilateral and clearing of subungual debris. No ulceration, no infection noted.  Return Visit-Office Procedure: Patient instructed to return to the office for a follow up visit 3 months for continued evaluation and treatment.    Helane Gunther DPM

## 2018-03-18 ENCOUNTER — Encounter: Payer: Self-pay | Admitting: Podiatry

## 2018-03-18 ENCOUNTER — Ambulatory Visit (INDEPENDENT_AMBULATORY_CARE_PROVIDER_SITE_OTHER): Payer: Medicare Other | Admitting: Podiatry

## 2018-03-18 DIAGNOSIS — M79675 Pain in left toe(s): Secondary | ICD-10-CM

## 2018-03-18 DIAGNOSIS — M79674 Pain in right toe(s): Secondary | ICD-10-CM

## 2018-03-18 DIAGNOSIS — B351 Tinea unguium: Secondary | ICD-10-CM | POA: Diagnosis not present

## 2018-03-18 NOTE — Progress Notes (Signed)
Complaint:  Visit Type: Patient returns to my office for continued preventative foot care services. Complaint: Patient states" my nails have grown long and thick and become painful to walk and wear shoes" . The patient presents for preventative foot care services. No changes to ROS.  Patient is unable to self treat.  Podiatric Exam: Vascular: dorsalis pedis and posterior tibial pulses are palpable bilateral. Capillary return is immediate. Temperature gradient is WNL. Skin turgor WNL  Sensorium: Normal Semmes Weinstein monofilament test. Normal tactile sensation bilaterally. Nail Exam: Pt has thick disfigured discolored nails with subungual debris noted bilateral entire nail hallux through fifth toenails Ulcer Exam: There is no evidence of ulcer or pre-ulcerative changes or infection. Orthopedic Exam: Muscle tone and strength are WNL. No limitations in general ROM. No crepitus or effusions noted. Foot type and digits show no abnormalities. Bony prominences are unremarkable. Skin: No Porokeratosis. No infection or ulcers  Diagnosis:  Onychomycosis, , Pain in right toe, pain in left toes  Treatment & Plan Procedures and Treatment: Consent by patient was obtained for treatment procedures.   Debridement of mycotic and hypertrophic toenails, 1 through 5 bilateral and clearing of subungual debris. No ulceration, no infection noted.  Return Visit-Office Procedure: Patient instructed to return to the office for a follow up visit 10 weeks  for continued evaluation and treatment.    Helane Gunther DPM

## 2018-05-31 ENCOUNTER — Ambulatory Visit: Payer: Non-veteran care | Admitting: Podiatry

## 2018-06-17 ENCOUNTER — Telehealth: Payer: Self-pay | Admitting: Podiatry

## 2018-06-17 NOTE — Telephone Encounter (Signed)
Pt called to advise us that patient passed away on 12.22.19 and to cancel his appt.

## 2018-06-21 ENCOUNTER — Ambulatory Visit: Payer: Medicare Other | Admitting: Podiatry

## 2018-06-23 DEATH — deceased
